# Patient Record
Sex: Female | Born: 1960 | Race: White | Hispanic: No | Marital: Single | Smoking: Current every day smoker
Health system: Southern US, Community
[De-identification: ages and names within clinical notes are randomized; demographics above are authoritative.]

## PROBLEM LIST (undated history)

## (undated) HISTORY — PX: CHOLECYSTECTOMY: SHX55

---

## 2004-11-25 ENCOUNTER — Encounter: Admission: RE | Admit: 2004-11-25 | Discharge: 2004-11-25 | Payer: Self-pay | Admitting: Internal Medicine

## 2005-08-08 ENCOUNTER — Encounter: Admission: RE | Admit: 2005-08-08 | Discharge: 2005-08-08 | Payer: Self-pay | Admitting: Internal Medicine

## 2015-09-11 ENCOUNTER — Emergency Department (HOSPITAL_COMMUNITY)
Admission: EM | Admit: 2015-09-11 | Discharge: 2015-09-12 | Disposition: A | Discharge status: Home / Self Care | Payer: BLUE CROSS/BLUE SHIELD | Attending: Emergency Medicine | Admitting: Emergency Medicine

## 2015-09-11 ENCOUNTER — Emergency Department (HOSPITAL_COMMUNITY): Payer: BLUE CROSS/BLUE SHIELD

## 2015-09-11 ENCOUNTER — Encounter (HOSPITAL_COMMUNITY): Payer: Self-pay | Admitting: Emergency Medicine

## 2015-09-11 DIAGNOSIS — R110 Nausea: Secondary | ICD-10-CM | POA: Diagnosis not present

## 2015-09-11 DIAGNOSIS — F17210 Nicotine dependence, cigarettes, uncomplicated: Secondary | ICD-10-CM | POA: Insufficient documentation

## 2015-09-11 DIAGNOSIS — E860 Dehydration: Secondary | ICD-10-CM | POA: Insufficient documentation

## 2015-09-11 DIAGNOSIS — Z880 Allergy status to penicillin: Secondary | ICD-10-CM | POA: Diagnosis not present

## 2015-09-11 DIAGNOSIS — R531 Weakness: Secondary | ICD-10-CM | POA: Diagnosis not present

## 2015-09-11 DIAGNOSIS — Z79899 Other long term (current) drug therapy: Secondary | ICD-10-CM | POA: Insufficient documentation

## 2015-09-11 DIAGNOSIS — R42 Dizziness and giddiness: Secondary | ICD-10-CM | POA: Diagnosis not present

## 2015-09-11 DIAGNOSIS — R55 Syncope and collapse: Secondary | ICD-10-CM | POA: Diagnosis present

## 2015-09-11 LAB — URINALYSIS, ROUTINE W REFLEX MICROSCOPIC
Bilirubin Urine: NEGATIVE
GLUCOSE, UA: NEGATIVE mg/dL
HGB URINE DIPSTICK: NEGATIVE
KETONES UR: NEGATIVE mg/dL
Leukocytes, UA: NEGATIVE
Nitrite: NEGATIVE
PROTEIN: NEGATIVE mg/dL
Specific Gravity, Urine: 1.01 (ref 1.005–1.030)
pH: 5.5 (ref 5.0–8.0)

## 2015-09-11 LAB — CBC WITH DIFFERENTIAL/PLATELET
BASOS ABS: 0 10*3/uL (ref 0.0–0.1)
BASOS PCT: 0 %
EOS PCT: 1 %
Eosinophils Absolute: 0.1 10*3/uL (ref 0.0–0.7)
HCT: 44 % (ref 36.0–46.0)
Hemoglobin: 15.1 g/dL — ABNORMAL HIGH (ref 12.0–15.0)
Lymphocytes Relative: 6 %
Lymphs Abs: 0.4 10*3/uL — ABNORMAL LOW (ref 0.7–4.0)
MCH: 32.5 pg (ref 26.0–34.0)
MCHC: 34.3 g/dL (ref 30.0–36.0)
MCV: 94.8 fL (ref 78.0–100.0)
MONO ABS: 0.5 10*3/uL (ref 0.1–1.0)
Monocytes Relative: 6 %
Neutro Abs: 6.4 10*3/uL (ref 1.7–7.7)
Neutrophils Relative %: 87 %
PLATELETS: 139 10*3/uL — AB (ref 150–400)
RBC: 4.64 MIL/uL (ref 3.87–5.11)
RDW: 13.3 % (ref 11.5–15.5)
WBC: 7.3 10*3/uL (ref 4.0–10.5)

## 2015-09-11 LAB — COMPREHENSIVE METABOLIC PANEL
ALBUMIN: 4.3 g/dL (ref 3.5–5.0)
ALT: 15 U/L (ref 14–54)
AST: 16 U/L (ref 15–41)
Alkaline Phosphatase: 64 U/L (ref 38–126)
Anion gap: 10 (ref 5–15)
BUN: 20 mg/dL (ref 6–20)
CHLORIDE: 102 mmol/L (ref 101–111)
CO2: 26 mmol/L (ref 22–32)
Calcium: 9.1 mg/dL (ref 8.9–10.3)
Creatinine, Ser: 0.59 mg/dL (ref 0.44–1.00)
GFR calc Af Amer: >60 mL/min (ref >60)
Glucose, Bld: 127 mg/dL — ABNORMAL HIGH (ref 65–99)
POTASSIUM: 4 mmol/L (ref 3.5–5.1)
Sodium: 138 mmol/L (ref 135–145)
Total Bilirubin: 0.7 mg/dL (ref 0.3–1.2)
Total Protein: 7.2 g/dL (ref 6.5–8.1)

## 2015-09-11 LAB — I-STAT TROPONIN, ED: TROPONIN I, POC: 0 ng/mL (ref 0.00–0.08)

## 2015-09-11 MED ORDER — ONDANSETRON 4 MG PO TBDP: ORAL_TABLET | ORAL | Status: AC

## 2015-09-11 MED ORDER — SODIUM CHLORIDE 0.9 % IV BOLUS (SEPSIS)
2000.0000 mL | Freq: Once | INTRAVENOUS | Status: AC
  Administered 2015-09-11: 2000 mL via INTRAVENOUS

## 2015-09-11 MED ORDER — ONDANSETRON HCL 4 MG/2ML IJ SOLN
4.0000 mg | Freq: Once | INTRAMUSCULAR | Status: AC
  Administered 2015-09-11: 4 mg via INTRAVENOUS
  Filled 2015-09-11: qty 2

## 2015-09-11 NOTE — ED Provider Notes (Signed)
CSN: [id number]     Arrival date & time 09/11/15  2045 History  By signing my name below, I, Arina Torry, attest that this documentation has been prepared under the direction and in the presence of Bethann Berkshire, MD. Electronically Signed: Gonzella Lex, Scribe. 09/11/2015. 9:58 PM.   Chief Complaint  Patient presents with  . Loss of Consciousness   Patient is a 55 y.o. female presenting with syncope. The history is provided by the patient and a parent (pt complains of syncope ).  Loss of Consciousness Episode history:  Multiple Most recent episode:  Today Timing:  Sporadic Progression:  Unchanged Chronicity:  New Context: bowel movement   Witnessed: no   Relieved by:  Nothing Worsened by:  Nothing tried Ineffective treatments:  None tried Associated symptoms: dizziness, nausea and weakness   Associated symptoms: no chest pain, no fever, no headaches, no seizures and no vomiting   Nausea:    Severity:  Mild   Onset quality:  Sudden   Timing:  Constant   Progression:  Unchanged   HPI Comments: Robin Singleton is a 55 y.o. female who presents to the Emergency Department complaining of sudden onset of two episodes of syncope which occurred earlier this evening. She also reports associated dizziness, light headedness, and nausea. Pt's mother reports that she fell down at work today. When pt got home she felt weak and slept for three hours before waking up and attempting to use the restroom. Pt passed out while trying to have a bowel movement twice and hit her head on the bathtub both times. Her mother reports that she was able to have normal bowel movements. Pt recently returned from a trip to [place] where she visited family members who had recently gotten over a virus with symptoms that included nausea, vomiting and diarrhea. She denies emesis, cough, sore throat, generalized pain, and fever. Pt takes medication for regulation of her blood pressure and  cholesterol levels.      History reviewed. No pertinent past medical history. Past Surgical History  Procedure Laterality Date  . Cholecystectomy     History reviewed. No pertinent family history. Social History  Substance Use Topics  . Smoking status: Current Every Day Smoker    Types: Cigarettes  . Smokeless tobacco: None  . Alcohol Use: No   OB History    No data available     Review of Systems  Constitutional: Negative for fever, appetite change and fatigue.  HENT: Negative for congestion, ear discharge, sinus pressure and sore throat.   Eyes: Negative for discharge.  Respiratory: Negative for cough.   Cardiovascular: Positive for syncope. Negative for chest pain.  Gastrointestinal: Positive for nausea. Negative for vomiting, abdominal pain and diarrhea.  Genitourinary: Negative for frequency and hematuria.  Musculoskeletal: Negative for back pain.  Skin: Negative for rash.  Neurological: Positive for dizziness, weakness and light-headedness. Negative for seizures and headaches.  Psychiatric/Behavioral: Negative for hallucinations.   Allergies  Penicillins  Home Medications   Prior to Admission medications   Medication Sig Start Date End Date Taking? Authorizing Provider  ATENOLOL PO Take 1 tablet by mouth daily.   Yes Historical Provider, MD  atorvastatin (LIPITOR) 80 MG tablet Take 80 mg by mouth daily.   Yes Historical Provider, MD  hydrochlorothiazide (MICROZIDE) 12.5 MG capsule Take 12.5 mg by mouth daily.   Yes Historical Provider, MD  naproxen sodium (ALEVE) 220 MG tablet Take 440 mg by mouth daily as needed (for pain).  Yes Historical Provider, MD   BP 112/65 mmHg  Pulse 81  Temp(Src) 97.6 F (36.4 C) (Oral)  Resp 24  Ht  (1.626 m)  Wt 210 lb (95.255 kg)  BMI 36.03 kg/m2  SpO2 97% Physical Exam  Constitutional: She is oriented to person, place, and time. She appears well-developed.  HENT:  Head: Normocephalic.  Eyes: Conjunctivae and EOM are  normal. No scleral icterus.  Neck: Neck supple. No thyromegaly present.  Cardiovascular: Normal rate and regular rhythm.  Exam reveals no gallop and no friction rub.   No murmur heard. Pulmonary/Chest: No stridor. She has no wheezes. She has no rales. She exhibits no tenderness.  Abdominal: She exhibits no distension. There is no tenderness. There is no rebound.  Musculoskeletal: Normal range of motion. She exhibits no edema.  Lymphadenopathy:    She has no cervical adenopathy.  Neurological: She is oriented to person, place, and time. She exhibits normal muscle tone. Coordination normal.  Skin: No rash noted. No erythema.  Psychiatric: She has a normal mood and affect. Her behavior is normal.    ED Course  Procedures  DIAGNOSTIC STUDIES:       COORDINATION OF CARE:  9:51 PM Will order chest xray and CT scan of head and cervical spine. Will order labs. Will administer medication in the ED. Discussed treatment plan with pt at bedside and pt agreed to plan.   Labs Review Labs Reviewed  URINALYSIS, ROUTINE W REFLEX MICROSCOPIC (NOT AT [facility])  CBC WITH DIFFERENTIAL/PLATELET  COMPREHENSIVE METABOLIC PANEL  I-STAT TROPOININ, ED   Imaging Review No results found. I have personally reviewed and evaluated these images and lab results as part of my medical decision-making.   EKG Interpretation None      MDM   Final diagnoses:  None    Patient with nausea weakness and near syncopal episode today. Patient has been exposed to people recently with a gastroenteritis. CBC, cmet troponin and urinalysis all unremarkable except for mild dehydration. Suspect patient has a viral infection. Have instructed patient to rest at home tomorrow drink plenty of fluids. Do not take your blood pressure medicine. She has been given Zofran for nausea and will follow-up with her PCP  The chart was scribed for me under my direct supervision.  I personally performed the history, physical, and medical  decision making and all procedures in the evaluation of this patient.Marland Kitchen Melvenia Needles The chart was scribed for me under my direct supervision.  I personally performed the history, physical, and medical decision making and all procedures in the evaluation of this patient.. The chart was scribed for me under my direct supervision.  I personally performed the history, physical, and medical decision making and all procedures in the evaluation of this patient.Bethann Berkshire, MD 09/11/15 [phone]

## 2015-09-11 NOTE — ED Notes (Signed)
Patient ambulatory to restroom  ?

## 2015-09-11 NOTE — ED Notes (Signed)
Pt c/o nausea and passed out twice per friend. Pt hit her head on bathtub.

## 2015-09-11 NOTE — Discharge Instructions (Signed)
Rest at home tomorrow. Drink plenty of fluids. Do not take your blood pressure medicine tomorrow. Follow-up with her family doctor in 2-3 days for recheck. Return if needed

## 2015-09-12 NOTE — ED Notes (Signed)
Patient verbalizes understanding of discharge instructions, prescription medications, home care and follow up care. Patient out of department at this time with family. 

## 2018-01-17 IMAGING — CT CT CERVICAL SPINE W/O CM
4 of 5 series · 14 of 33 positions shown, 16 images · non-contrast
Comparison: None.

CLINICAL DATA: 54-year-old female with 2 episodes of syncope
earlier today. Hit head on bathtub.

EXAM:
CT HEAD WITHOUT CONTRAST
CT CERVICAL SPINE WITHOUT CONTRAST
TECHNIQUE: Multidetector CT imaging of the head and cervical spine was
performed following the standard protocol without intravenous
contrast. Multiplanar CT image reconstructions of the cervical spine
were also generated.

[Series 5: cervical st 2.0 b31s · axial · 0.35mm/px · z∈[+86,+180]mm · 4 of 79 slices shown, 5 images]
[im 16/79  soft-tissue]
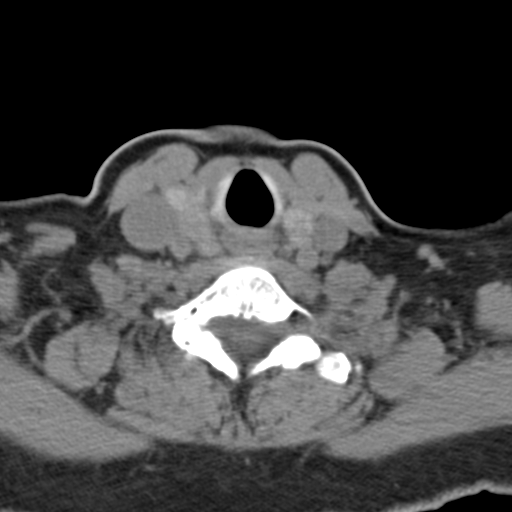
[im 16/79  bone]
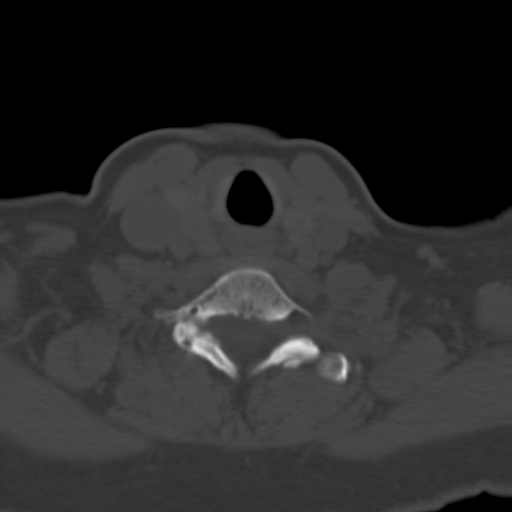
[im 32/79  bone]
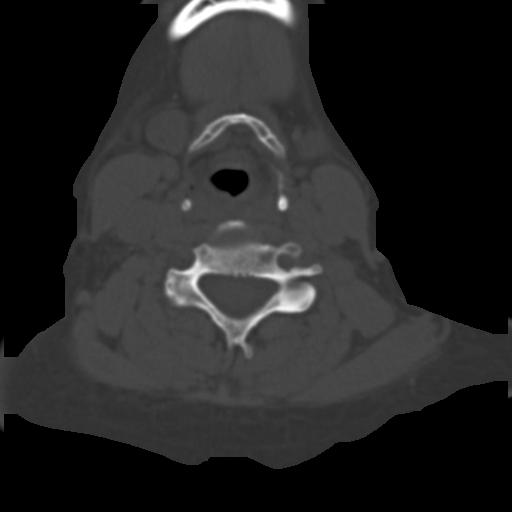
[im 47/79  bone]
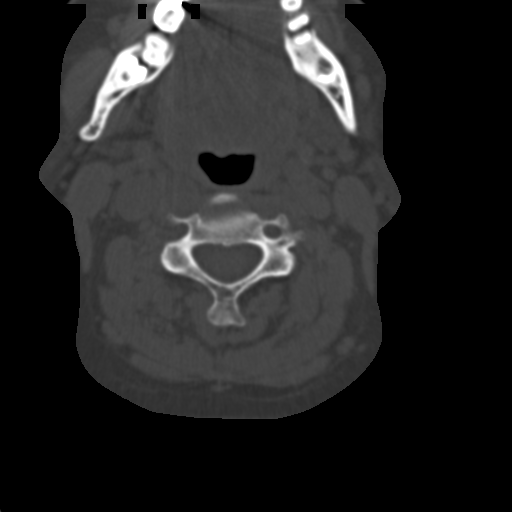
[im 63/79  bone]
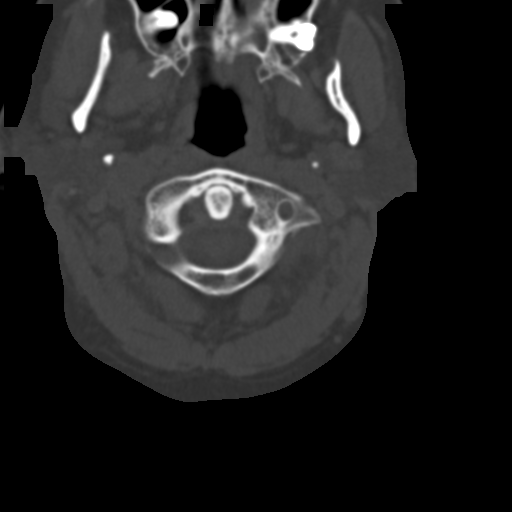

[Series 7: sagittal bone 2.0 · sagittal · 0.22mm/px · 5 of 59 slices shown, 6 images]
[im 20/59  bone]
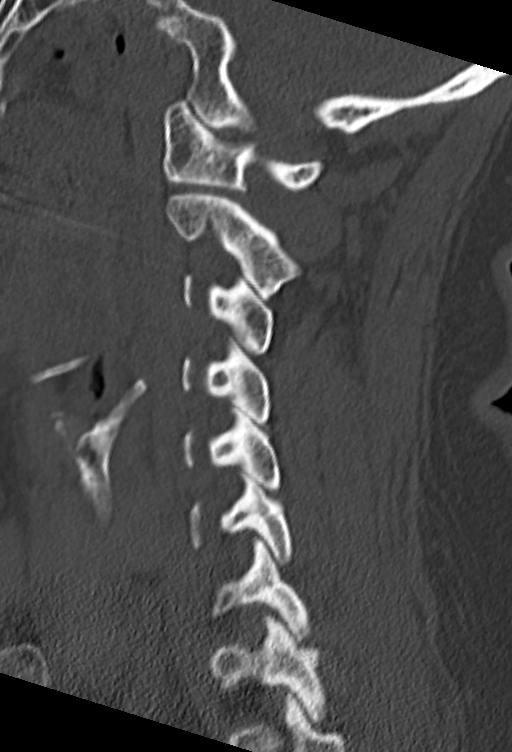
[im 25/59  bone]
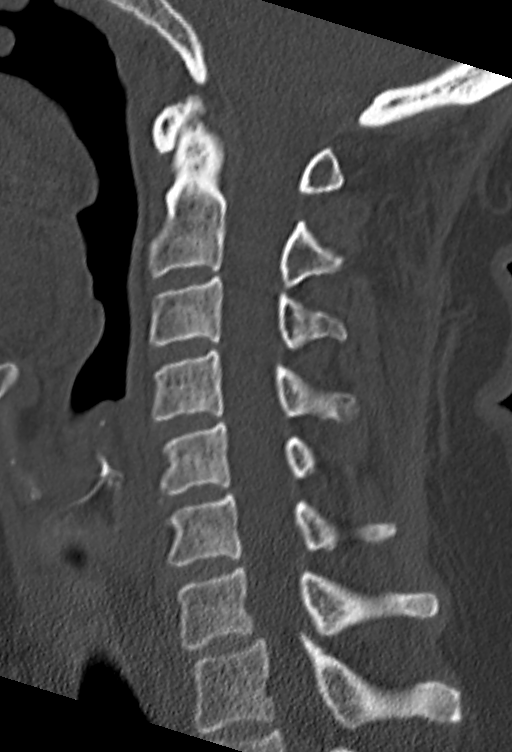
[im 30/59  soft-tissue]
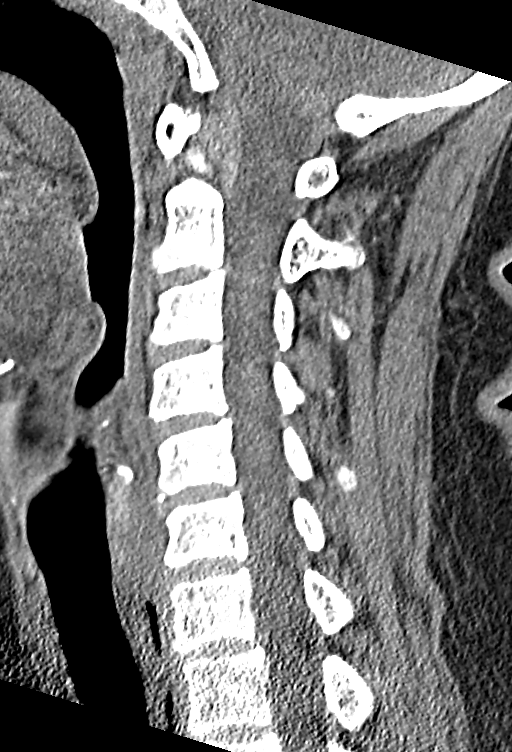
[im 30/59  bone]
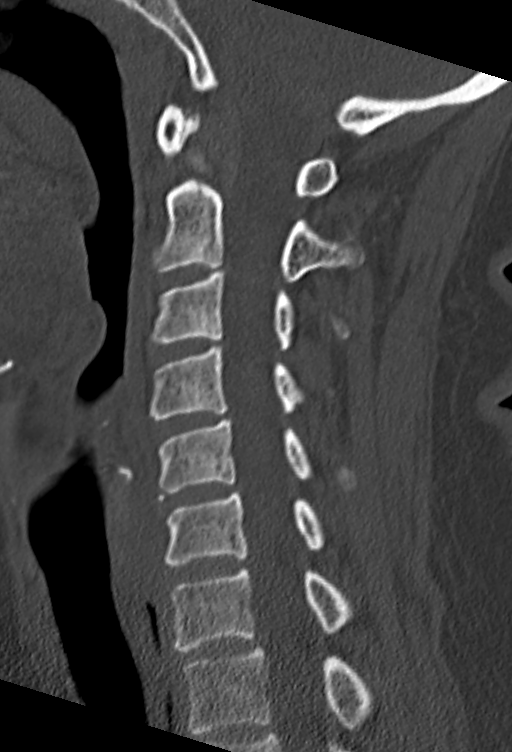
[im 34/59  bone]
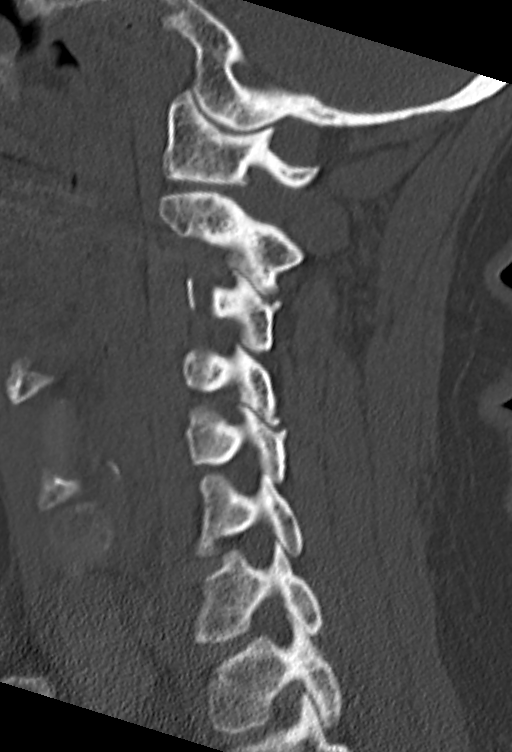
[im 39/59  bone]
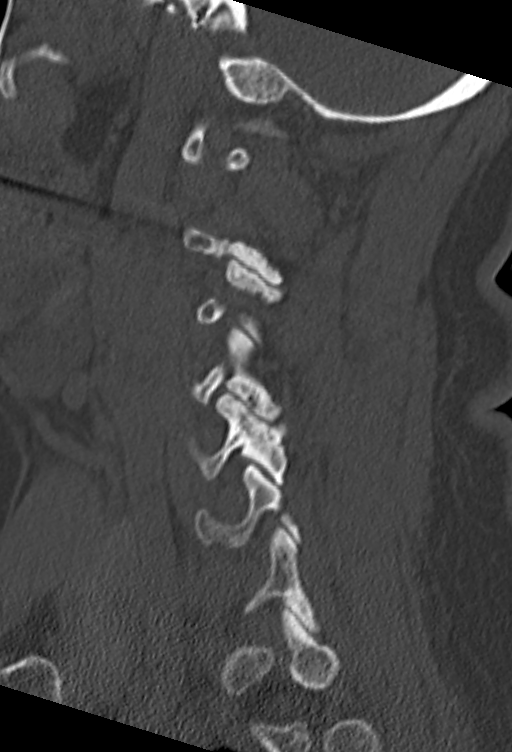

[Series 8: coronal bone 2.0 · coronal · 0.26mm/px · 3 of 52 slices shown]
[im 11/52  bone]
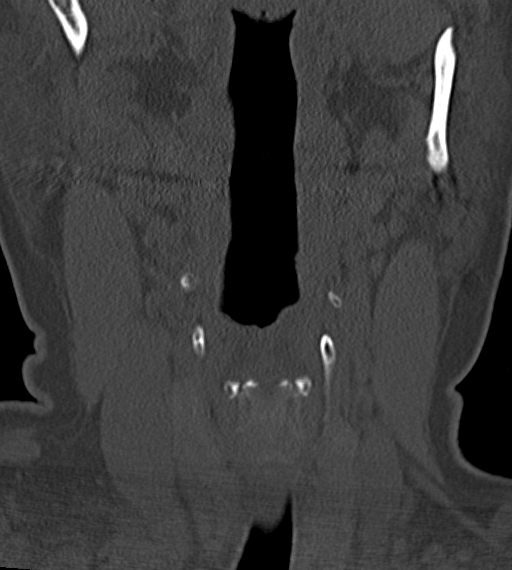
[im 21/52  bone]
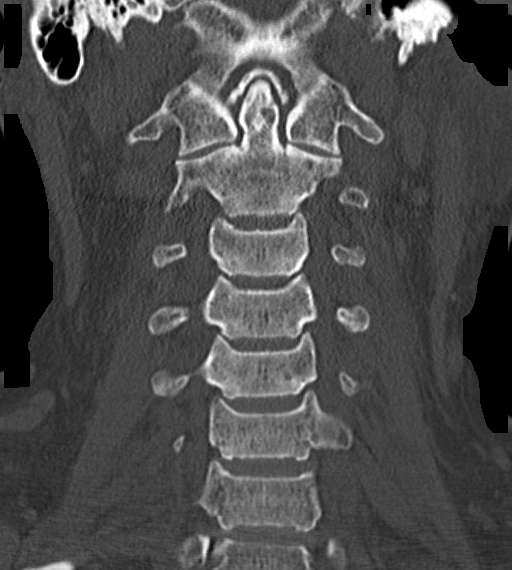
[im 31/52  bone]
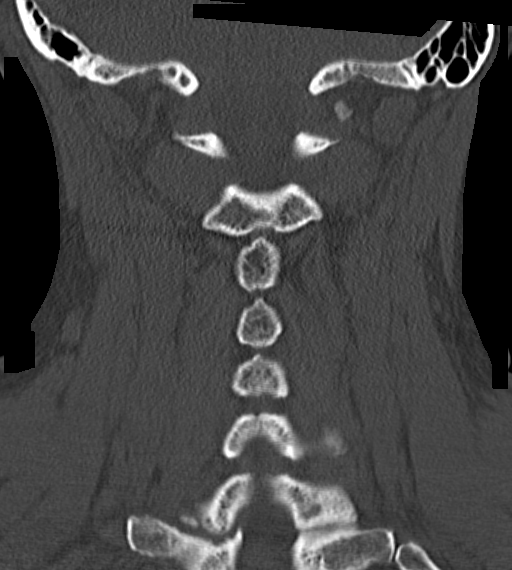

[Series 9: axial bone 2.0 · axial · 0.21mm/px · z∈[+60,+92]mm · 2 of 85 slices shown]
[im 17/85  bone]
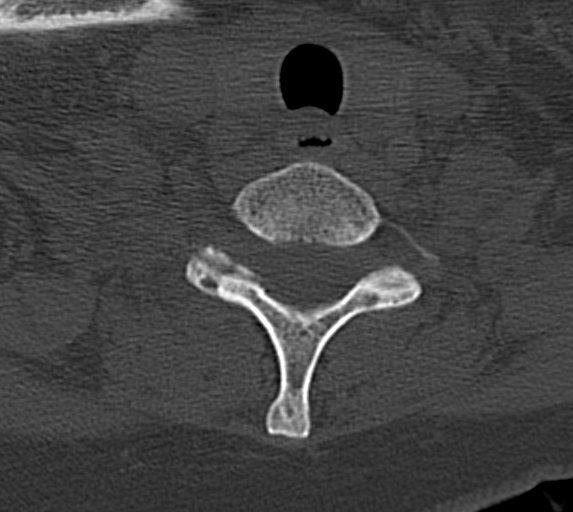
[im 34/85  bone]
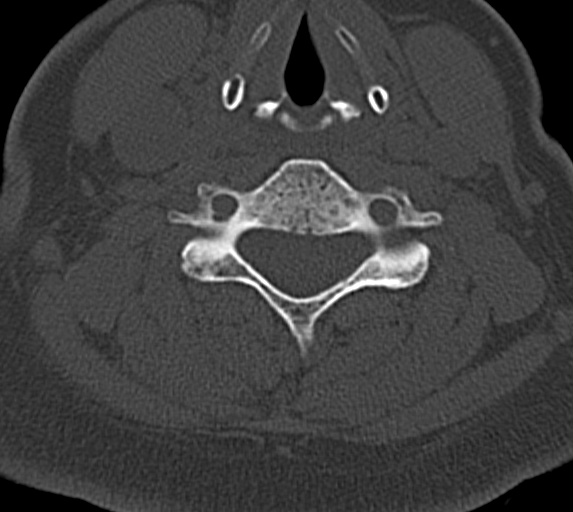

[14 of 33 positions shown; findings below may reference images not displayed]

FINDINGS: CT HEAD FINDINGS

Negative for acute intracranial hemorrhage, acute infarction, mass,
mass effect, hydrocephalus or midline shift. Gray-white
differentiation is preserved throughout. No focal scalp hematoma or
evidence of calvarial injury. Globes and orbits are intact and
symmetric bilaterally.

CT CERVICAL SPINE FINDINGS

No acute fracture, malalignment or prevertebral soft tissue
swelling. Mild left-sided facet arthropathy at C2-C3 and C4-C5.
Unremarkable CT appearance of the thyroid gland. No acute soft
tissue abnormality. The lung apices are unremarkable.
IMPRESSION: CT HEAD

1. Negative.
CT CSPINE

1. No acute fracture or malalignment.
2. Mild left-sided facet arthropathy at C2-C3 and C4-C5.
